# Patient Record
Sex: Female | Born: 1965 | Race: Black or African American | Hispanic: No | Marital: Married | Smoking: Never smoker
Health system: Southern US, Community
[De-identification: ages and names within clinical notes are randomized; demographics above are authoritative.]

## PROBLEM LIST (undated history)

## (undated) DIAGNOSIS — I10 Essential (primary) hypertension: Secondary | ICD-10-CM

---

## 2016-04-08 ENCOUNTER — Emergency Department (HOSPITAL_BASED_OUTPATIENT_CLINIC_OR_DEPARTMENT_OTHER)
Admission: EM | Admit: 2016-04-08 | Discharge: 2016-04-08 | Disposition: A | Discharge status: Home / Self Care | Payer: BLUE CROSS/BLUE SHIELD | Attending: Emergency Medicine | Admitting: Emergency Medicine

## 2016-04-08 ENCOUNTER — Encounter (HOSPITAL_BASED_OUTPATIENT_CLINIC_OR_DEPARTMENT_OTHER): Payer: Self-pay | Admitting: Emergency Medicine

## 2016-04-08 DIAGNOSIS — Z79899 Other long term (current) drug therapy: Secondary | ICD-10-CM | POA: Insufficient documentation

## 2016-04-08 DIAGNOSIS — R102 Pelvic and perineal pain: Secondary | ICD-10-CM

## 2016-04-08 DIAGNOSIS — I10 Essential (primary) hypertension: Secondary | ICD-10-CM | POA: Insufficient documentation

## 2016-04-08 DIAGNOSIS — N760 Acute vaginitis: Secondary | ICD-10-CM | POA: Insufficient documentation

## 2016-04-08 DIAGNOSIS — N390 Urinary tract infection, site not specified: Secondary | ICD-10-CM | POA: Insufficient documentation

## 2016-04-08 HISTORY — DX: Essential (primary) hypertension: I10

## 2016-04-08 LAB — URINALYSIS, ROUTINE W REFLEX MICROSCOPIC
Bilirubin Urine: NEGATIVE
Glucose, UA: NEGATIVE mg/dL
Ketones, ur: NEGATIVE mg/dL
Nitrite: NEGATIVE
Protein, ur: 100 mg/dL — AB
Specific Gravity, Urine: 1.011 (ref 1.005–1.030)
pH: 6.5 (ref 5.0–8.0)

## 2016-04-08 LAB — RAPID HIV SCREEN (HIV 1/2 AB+AG)
HIV 1/2 Antibodies: NONREACTIVE
HIV-1 P24 Antigen - HIV24: NONREACTIVE

## 2016-04-08 LAB — URINE MICROSCOPIC-ADD ON: SQUAMOUS EPITHELIAL / LPF: NONE SEEN

## 2016-04-08 LAB — WET PREP, GENITAL
Sperm: NONE SEEN
Trich, Wet Prep: NONE SEEN
Yeast Wet Prep HPF POC: NONE SEEN

## 2016-04-08 LAB — RAPID STREP SCREEN (MED CTR MEBANE ONLY): Streptococcus, Group A Screen (Direct): NEGATIVE

## 2016-04-08 MED ORDER — METRONIDAZOLE 500 MG PO TABS: 500.0000 mg | ORAL_TABLET | Freq: Two times a day (BID) | ORAL | 0 refills | Status: DC

## 2016-04-08 MED ORDER — IBUPROFEN 800 MG PO TABS
800.0000 mg | ORAL_TABLET | Freq: Once | ORAL | Status: AC
  Administered 2016-04-08: 800 mg via ORAL
  Filled 2016-04-08: qty 1

## 2016-04-08 MED ORDER — ACETAMINOPHEN 325 MG PO TABS
650.0000 mg | ORAL_TABLET | Freq: Once | ORAL | Status: AC
  Administered 2016-04-08: 650 mg via ORAL
  Filled 2016-04-08: qty 2

## 2016-04-08 MED ORDER — CEPHALEXIN 500 MG PO CAPS: 500.0000 mg | ORAL_CAPSULE | Freq: Four times a day (QID) | ORAL | 0 refills | Status: DC

## 2016-04-08 NOTE — ED Provider Notes (Signed)
[facility] Provider Note   CSN: [id number] Arrival date & time: 04/08/16  1548 By signing my name below, I, Georgette Shell, attest that this documentation has been prepared under the direction and in the presence of Deno Etienne, DO. Electronically Signed: Georgette Shell, ED Scribe. 04/08/16. 5:08 PM.  History   Chief Complaint Chief Complaint  Patient presents with  . Pelvic Pain  . Sore Throat   HPI Comments: Paula Shepherd is a 50 y.o. female who presents to the Emergency Department complaining of gradual onset, intermittent, sharp, shooting, 9/10, lower abdominal pain radiating to her bilateral pelvic area onset one day ago. Pt also has associated dysuria, sore throat, and nausea. She notes that pain is exacerbated with sitting. Pt states she spoke to a "teledoctor" who prescribed her a 3 day course of Bactrim which the pt has since completed with no relief. Pt denies fever, cough, vaginal bleeding, hematuria, vomiting, diarrhea, or any other associated symptoms.    The history is provided by the patient. No language interpreter was used.  Pelvic Pain  This is a new problem. The current episode started yesterday. The problem occurs daily. The problem has not changed since onset.Associated symptoms include abdominal pain. Pertinent negatives include no chest pain, no headaches and no shortness of breath. Treatments tried: antibiotic. The treatment provided no relief.    Past Medical History:  Diagnosis Date  . Hypertension     There are no active problems to display for this patient.   Past Surgical History:  Procedure Laterality Date  . CESAREAN SECTION      OB History    No data available       Home Medications    Prior to Admission medications   Medication Sig Start Date End Date Taking? Authorizing Provider  atenolol-chlorthalidone (TENORETIC) 50-25 MG tablet Take 1 tablet by mouth daily.   Yes Historical Provider, MD  sulfamethoxazole-trimethoprim (BACTRIM  DS,SEPTRA DS) 800-160 MG tablet Take 1 tablet by mouth 2 (two) times daily.   Yes Historical Provider, MD  cephALEXin (KEFLEX) 500 MG capsule Take 1 capsule (500 mg total) by mouth 4 (four) times daily. 04/08/16   Deno Etienne, DO  metroNIDAZOLE (FLAGYL) 500 MG tablet Take 1 tablet (500 mg total) by mouth 2 (two) times daily. One po bid x 7 days 04/08/16   Deno Etienne, DO    Family History History reviewed. No pertinent family history.  Social History Social History  Substance Use Topics  . Smoking status: Never Smoker  . Smokeless tobacco: Never Used  . Alcohol use No     Allergies   Review of patient's allergies indicates not on file.   Review of Systems Review of Systems  Constitutional: Negative for chills and fever.  HENT: Positive for sore throat. Negative for congestion and rhinorrhea.   Eyes: Negative for redness and visual disturbance.  Respiratory: Negative for shortness of breath and wheezing.   Cardiovascular: Negative for chest pain and palpitations.  Gastrointestinal: Positive for abdominal pain and nausea. Negative for vomiting.  Genitourinary: Positive for dysuria and pelvic pain. Negative for hematuria and urgency.  Musculoskeletal: Negative for arthralgias and myalgias.  Skin: Negative for pallor and wound.  Neurological: Negative for dizziness and headaches.  All other systems reviewed and are negative.    Physical Exam Updated Vital Signs BP 99/56 (BP Location: Right Arm)   Pulse 78   Temp 101.7 F (38.7 C) (Oral)   Resp 18   Ht 6\' 1"  (1.854 m)  Wt 276 lb (125.2 kg)   LMP 03/07/2016 (Exact Date)   SpO2 99%   BMI 36.41 kg/m   Physical Exam  Constitutional: She is oriented to person, place, and time. She appears well-developed and well-nourished. No distress.  HENT:  Head: Normocephalic and atraumatic.  Eyes: EOM are normal. Pupils are equal, round, and reactive to light.  Neck: Normal range of motion. Neck supple.  Cardiovascular: Normal rate and  regular rhythm.  Exam reveals no gallop and no friction rub.   No murmur heard. Pulmonary/Chest: Effort normal. She has no wheezes. She has no rales.  Abdominal: Soft. She exhibits no distension. There is tenderness.  Mild suprapubic tenderness.  Genitourinary: Cervix exhibits discharge and friability. Cervix exhibits no motion tenderness. Right adnexum displays no mass, no tenderness and no fullness. Left adnexum displays no mass, no tenderness and no fullness.  Genitourinary Comments: Chaperone present throughout entire exam.   Musculoskeletal: She exhibits no edema or tenderness.  Neurological: She is alert and oriented to person, place, and time.  Skin: Skin is warm and dry. She is not diaphoretic.  Psychiatric: She has a normal mood and affect. Her behavior is normal.  Nursing note and vitals reviewed.  ED Treatments / Results  DIAGNOSTIC STUDIES: Oxygen Saturation is 98% on RA, normal by my interpretation.    COORDINATION OF CARE: 5:04 PM Discussed treatment plan with pt at bedside which includes pelvic exam and pt agreed to plan.  Labs (all labs ordered are listed, but only abnormal results are displayed) Labs Reviewed  WET PREP, GENITAL - Abnormal; Notable for the following:       Result Value   Clue Cells Wet Prep HPF POC PRESENT (*)    WBC, Wet Prep HPF POC FEW (*)    All other components within normal limits  URINALYSIS, ROUTINE W REFLEX MICROSCOPIC (NOT AT [facility]) - Abnormal; Notable for the following:    APPearance TURBID (*)    Hgb urine dipstick LARGE (*)    Protein, ur 100 (*)    Leukocytes, UA LARGE (*)    All other components within normal limits  URINE MICROSCOPIC-ADD ON - Abnormal; Notable for the following:    Bacteria, UA MANY (*)    All other components within normal limits  RAPID STREP SCREEN (NOT AT [facility])  CULTURE, GROUP A STREP ([place])  RAPID HIV SCREEN (HIV 1/2 AB+AG)  RPR  GC/CHLAMYDIA PROBE AMP ([place]) NOT AT [facility]    EKG  EKG  Interpretation None       Radiology No results found.  Procedures Procedures (including critical care time)  Medications Ordered in ED Medications  acetaminophen (TYLENOL) tablet 650 mg (650 mg Oral Given 04/08/16 1617)  ibuprofen (ADVIL,MOTRIN) tablet 800 mg (800 mg Oral Given 04/08/16 1817)     Initial Impression / Assessment and Plan / ED Course  I have reviewed the triage vital signs and the nursing notes.  Pertinent labs & imaging results that were available during my care of the patient were reviewed by me and considered in my medical decision making (see chart for details).  Clinical Course    50 yo F With a chief complaint of pelvic pain dysuria and sore throat. Going on for the past couple days. On my exam patient has significant vaginal discharge but no noted cervical motion or adnexal tenderness. Patient is febrile in the ED. I doubt that she has a tubo-ovarian abscess without adnexal tenderness. Rapid strep negative. Patient does have a urinary tract  infection. Will treat with Keflex. PCP follow-up.  11:11 PM:  I have discussed the diagnosis/risks/treatment options with the patient and family and believe the pt to be eligible for discharge home to follow-up with PCP. We also discussed returning to the ED immediately if new or worsening sx occur. We discussed the sx which are most concerning (e.g., sudden worsening pain, fever, inability to tolerate by mouth) that necessitate immediate return. Medications administered to the patient during their visit and any new prescriptions provided to the patient are listed below.  Medications given during this visit Medications  acetaminophen (TYLENOL) tablet 650 mg (650 mg Oral Given 04/08/16 1617)  ibuprofen (ADVIL,MOTRIN) tablet 800 mg (800 mg Oral Given 04/08/16 1817)     The patient appears reasonably screen and/or stabilized for discharge and I doubt any other medical condition or other [facility] requiring further screening,  evaluation, or treatment in the ED at this time prior to discharge.    Final Clinical Impressions(s) / ED Diagnoses   Final diagnoses:  Pelvic pain in female  UTI (lower urinary tract infection)  BV  New Prescriptions Discharge Medication List as of 04/08/2016  6:08 PM    START taking these medications   Details  cephALEXin (KEFLEX) 500 MG capsule Take 1 capsule (500 mg total) by mouth 4 (four) times daily., Starting Sun 04/08/2016, Print    metroNIDAZOLE (FLAGYL) 500 MG tablet Take 1 tablet (500 mg total) by mouth 2 (two) times daily. One po bid x 7 days, Starting Sun 04/08/2016, Print       I personally performed the services described in this documentation, which was scribed in my presence. The recorded information has been reviewed and is accurate.     Deno Etienne, DO 04/08/16 2312

## 2016-04-08 NOTE — ED Triage Notes (Addendum)
Patient reports pelvic pain since yesterday.  Denies vaginal bleeding or discharge.  Describes the pain as a shooting sensation.  Reports nausea.  Denies vomiting, diarrhea. States the pain starts in the lower abdomen and radiates around to both sides.  Reports dysuria.  Denies hematuria.  States she spoke with a "teledoctor" who prescribed her bactrim x3days.  Reports no relief from this.  Patient also reports sore throat as well.

## 2016-04-09 LAB — GC/CHLAMYDIA PROBE AMP (~~LOC~~) NOT AT ARMC
CHLAMYDIA, DNA PROBE: NEGATIVE
Neisseria Gonorrhea: NEGATIVE

## 2016-04-10 ENCOUNTER — Telehealth (HOSPITAL_BASED_OUTPATIENT_CLINIC_OR_DEPARTMENT_OTHER): Payer: Self-pay | Admitting: Emergency Medicine

## 2016-04-10 LAB — RPR: RPR: NONREACTIVE

## 2016-04-11 LAB — CULTURE, GROUP A STREP (THRC)

## 2017-07-11 ENCOUNTER — Emergency Department (HOSPITAL_BASED_OUTPATIENT_CLINIC_OR_DEPARTMENT_OTHER): Payer: BLUE CROSS/BLUE SHIELD

## 2017-07-11 ENCOUNTER — Encounter (HOSPITAL_BASED_OUTPATIENT_CLINIC_OR_DEPARTMENT_OTHER): Payer: Self-pay

## 2017-07-11 ENCOUNTER — Emergency Department (HOSPITAL_BASED_OUTPATIENT_CLINIC_OR_DEPARTMENT_OTHER)
Admission: EM | Admit: 2017-07-11 | Discharge: 2017-07-12 | Disposition: A | Discharge status: Home / Self Care | Payer: BLUE CROSS/BLUE SHIELD | Attending: Emergency Medicine | Admitting: Emergency Medicine

## 2017-07-11 ENCOUNTER — Other Ambulatory Visit: Payer: Self-pay

## 2017-07-11 DIAGNOSIS — R110 Nausea: Secondary | ICD-10-CM | POA: Diagnosis not present

## 2017-07-11 DIAGNOSIS — D259 Leiomyoma of uterus, unspecified: Secondary | ICD-10-CM | POA: Insufficient documentation

## 2017-07-11 DIAGNOSIS — R1031 Right lower quadrant pain: Secondary | ICD-10-CM | POA: Diagnosis not present

## 2017-07-11 DIAGNOSIS — I10 Essential (primary) hypertension: Secondary | ICD-10-CM | POA: Insufficient documentation

## 2017-07-11 DIAGNOSIS — R109 Unspecified abdominal pain: Secondary | ICD-10-CM | POA: Diagnosis present

## 2017-07-11 LAB — COMPREHENSIVE METABOLIC PANEL
ALBUMIN: 3.7 g/dL (ref 3.5–5.0)
ALK PHOS: 77 U/L (ref 38–126)
ALT: 20 U/L (ref 14–54)
ANION GAP: 6 (ref 5–15)
AST: 24 U/L (ref 15–41)
BUN: 14 mg/dL (ref 6–20)
CALCIUM: 8.9 mg/dL (ref 8.9–10.3)
CO2: 29 mmol/L (ref 22–32)
Chloride: 103 mmol/L (ref 101–111)
Creatinine, Ser: 0.7 mg/dL (ref 0.44–1.00)
GFR calc Af Amer: >60 mL/min (ref >60)
GFR calc non Af Amer: >60 mL/min (ref >60)
GLUCOSE: 118 mg/dL — AB (ref 65–99)
Potassium: 3.5 mmol/L (ref 3.5–5.1)
SODIUM: 138 mmol/L (ref 135–145)
Total Bilirubin: 0.4 mg/dL (ref 0.3–1.2)
Total Protein: 6.9 g/dL (ref 6.5–8.1)

## 2017-07-11 LAB — URINALYSIS, ROUTINE W REFLEX MICROSCOPIC
Bilirubin Urine: NEGATIVE
GLUCOSE, UA: NEGATIVE mg/dL
Hgb urine dipstick: NEGATIVE
Ketones, ur: NEGATIVE mg/dL
NITRITE: NEGATIVE
PH: 6 (ref 5.0–8.0)
Protein, ur: NEGATIVE mg/dL
SPECIFIC GRAVITY, URINE: 1.025 (ref 1.005–1.030)

## 2017-07-11 LAB — CBC WITH DIFFERENTIAL/PLATELET
BASOS ABS: 0 10*3/uL (ref 0.0–0.1)
BASOS PCT: 0 %
EOS ABS: 0.3 10*3/uL (ref 0.0–0.7)
Eosinophils Relative: 4 %
HCT: 35.4 % — ABNORMAL LOW (ref 36.0–46.0)
HEMOGLOBIN: 11.6 g/dL — AB (ref 12.0–15.0)
Lymphocytes Relative: 18 %
Lymphs Abs: 1.6 10*3/uL (ref 0.7–4.0)
MCH: 26.5 pg (ref 26.0–34.0)
MCHC: 32.8 g/dL (ref 30.0–36.0)
MCV: 81 fL (ref 78.0–100.0)
Monocytes Absolute: 0.8 10*3/uL (ref 0.1–1.0)
Monocytes Relative: 9 %
NEUTROS PCT: 69 %
Neutro Abs: 5.9 10*3/uL (ref 1.7–7.7)
Platelets: 280 10*3/uL (ref 150–400)
RBC: 4.37 MIL/uL (ref 3.87–5.11)
RDW: 14 % (ref 11.5–15.5)
WBC: 8.6 10*3/uL (ref 4.0–10.5)

## 2017-07-11 LAB — URINALYSIS, MICROSCOPIC (REFLEX): RBC / HPF: NONE SEEN RBC/hpf (ref 0–5)

## 2017-07-11 LAB — PREGNANCY, URINE: Preg Test, Ur: NEGATIVE

## 2017-07-11 LAB — LIPASE, BLOOD: Lipase: 21 U/L (ref 11–51)

## 2017-07-11 MED ORDER — HYDROMORPHONE HCL 1 MG/ML IJ SOLN
0.5000 mg | Freq: Once | INTRAMUSCULAR | Status: AC
  Administered 2017-07-12: 0.5 mg via INTRAVENOUS
  Filled 2017-07-11: qty 1

## 2017-07-11 MED ORDER — ONDANSETRON HCL 4 MG/2ML IJ SOLN
4.0000 mg | Freq: Once | INTRAMUSCULAR | Status: AC
  Administered 2017-07-11: 4 mg via INTRAVENOUS
  Filled 2017-07-11: qty 2

## 2017-07-11 MED ORDER — HYDROMORPHONE HCL 1 MG/ML IJ SOLN
0.5000 mg | Freq: Once | INTRAMUSCULAR | Status: AC
Start: 2017-07-11 — End: 2017-07-11
  Administered 2017-07-11: 0.5 mg via INTRAVENOUS
  Filled 2017-07-11: qty 1

## 2017-07-11 MED ORDER — IOPAMIDOL (ISOVUE-300) INJECTION 61%
100.0000 mL | Freq: Once | INTRAVENOUS | Status: AC | PRN
  Administered 2017-07-11: 100 mL via INTRAVENOUS

## 2017-07-11 NOTE — ED Triage Notes (Signed)
C/o right abd, flank pain x 7 days-nausea-no v/d-NAD-steady gait

## 2017-07-11 NOTE — ED Provider Notes (Signed)
[facility] EMERGENCY DEPARTMENT Provider Note   CSN: [id number] Arrival date & time: 07/11/17  1957     History   Chief Complaint Chief Complaint  Patient presents with  . Abdominal Pain    HPI Paula Shepherd is a 51 y.o. female.  Patient with no past surgical history presents with complaint of abdominal pain ongoing for the past week.  Onset was acute and pain has been gradually worsening.  Patient initially had pain across her entire lower abdomen but now it is more on the right side of the abdomen, right upper quadrant and flank down to her right lower quadrant and into the suprapubic area.  She has decreased appetite and nausea but no vomiting.  No fevers, constipation, diarrhea.  No vaginal bleeding or discharge.  No dysuria or hematuria.  No treatments prior to arrival.  Patient presents tonight as symptoms have not been improving over time.  No history of kidney stones.      Past Medical History:  Diagnosis Date  . Hypertension     There are no active problems to display for this patient.   Past Surgical History:  Procedure Laterality Date  . CESAREAN SECTION      OB History    No data available       Home Medications    Prior to Admission medications   Not on File    Family History No family history on file.  Social History Social History   Tobacco Use  . Smoking status: Never Smoker  . Smokeless tobacco: Never Used  Substance Use Topics  . Alcohol use: Yes    Comment: occ  . Drug use: No     Allergies   Patient has no known allergies.   Review of Systems Review of Systems  Constitutional: Positive for appetite change. Negative for fever.  HENT: Negative for rhinorrhea and sore throat.   Eyes: Negative for redness.  Respiratory: Negative for cough.   Cardiovascular: Negative for chest pain.  Gastrointestinal: Positive for abdominal pain and nausea. Negative for diarrhea and vomiting.  Genitourinary: Negative for dysuria.    Musculoskeletal: Negative for myalgias.  Skin: Negative for rash.  Neurological: Negative for headaches.     Physical Exam Updated Vital Signs BP 123/65 (BP Location: Left Arm)   Pulse 72   Temp 98.6 F (37 C) (Oral)   Resp 18   Ht 6\' 1"  (1.854 m)   Wt 130.2 kg (287 lb)   LMP 03/19/2017   SpO2 100%   BMI 37.87 kg/m   Physical Exam  Constitutional: She appears well-developed and well-nourished.  HENT:  Head: Normocephalic and atraumatic.  Eyes: Conjunctivae are normal. Right eye exhibits no discharge. Left eye exhibits no discharge.  Neck: Normal range of motion. Neck supple.  Cardiovascular: Normal rate, regular rhythm and normal heart sounds.  Pulmonary/Chest: Effort normal and breath sounds normal.  Abdominal: Soft. There is tenderness (Tenderness is mild except in the right lower quadrant where it is moderate.) in the right upper quadrant, right lower quadrant, periumbilical area and suprapubic area. There is no rigidity, no rebound, no guarding, no CVA tenderness, no tenderness at McBurney's point and negative Murphy's sign.  Neurological: She is alert.  Skin: Skin is warm and dry.  Psychiatric: She has a normal mood and affect.  Nursing note and vitals reviewed.    ED Treatments / Results  Labs (all labs ordered are listed, but only abnormal results are displayed) Labs Reviewed  URINALYSIS, ROUTINE W  REFLEX MICROSCOPIC - Abnormal; Notable for the following components:      Result Value   Leukocytes, UA TRACE (*)    All other components within normal limits  URINALYSIS, MICROSCOPIC (REFLEX) - Abnormal; Notable for the following components:   Bacteria, UA RARE (*)    Squamous Epithelial / LPF 0-5 (*)    All other components within normal limits  CBC WITH DIFFERENTIAL/PLATELET - Abnormal; Notable for the following components:   Hemoglobin 11.6 (*)    HCT 35.4 (*)    All other components within normal limits  COMPREHENSIVE METABOLIC PANEL - Abnormal; Notable for  the following components:   Glucose, Bld 118 (*)    All other components within normal limits  PREGNANCY, URINE  LIPASE, BLOOD    EKG  EKG Interpretation None       Radiology Ct Abdomen Pelvis W Contrast  Result Date: 07/11/2017 CLINICAL DATA:  Right-sided abdominal pain and nausea for 1 week EXAM: CT ABDOMEN AND PELVIS WITH CONTRAST TECHNIQUE: Multidetector CT imaging of the abdomen and pelvis was performed using the standard protocol following bolus administration of intravenous contrast. Oral contrast was also administered. CONTRAST:  151mL ISOVUE-300 IOPAMIDOL (ISOVUE-300) INJECTION 61% COMPARISON:  None. FINDINGS: Lower chest: There is atelectatic change in the inferior lingula. Lung bases otherwise are clear. There are foci of aortic atherosclerosis. Hepatobiliary: No focal liver lesions are apparent beyond a tiny cyst in the right lobe anteriorly. Gallbladder wall is not appreciably thickened. There is no biliary duct dilatation. Pancreas: No pancreatic mass or inflammatory focus. Spleen: No splenic lesions are evident. Adrenals/Urinary Tract: Adrenals appear normal. Kidneys bilaterally show no evident mass or hydronephrosis on either side. There is no renal or ureteral calculus on either side. Urinary bladder is midline with wall thickness within normal limits. Stomach/Bowel: There are scattered colonic diverticula without diverticulitis. There is no appreciable bowel wall or mesenteric thickening. No evident bowel obstruction. No free air or portal venous air. Vascular/Lymphatic: There is aortoiliac atherosclerosis. Major mesenteric vessels appear patent. There is no adenopathy evident in the abdomen or pelvis. Reproductive: The uterus is anteverted. Uterus is enlarged and inhomogeneous with multiple leiomyomas within the uterus evident. Largest leiomyoma is along the rightward aspect of the uterus anteriorly measuring 6.3 x 4.7 cm. Apart from the uterus, there are no pelvic masses. Other:  Appendix appears normal. No abscess or ascites is evident in the abdomen or pelvis. There is a small ventral hernia containing only fat. Musculoskeletal: There is multilevel degenerative change in the visualized thoracic and lumbar spine regions. There is spinal stenosis, moderately severe, at L3-4 and L4-5 due to diffuse bony hypertrophy and disc protrusion. 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 No blastic or lytic bone lesions are evident. No intramuscular or abdominal wall lesion evident. IMPRESSION: 1.  Appendix appears normal.  No bowel obstruction.  No abscess. 2.  Enlarged leiomyomatous uterus. 3. Moderately severe spinal stenosis at L3-4 and L4-5, multifactorial. 4.  Aortoiliac atherosclerosis. 5.  Small ventral hernia containing only fat. Aortic Atherosclerosis (ICD10-I70.0). Electronically Signed   By: Lowella Grip III M.D.   On: 07/11/2017 23:13    Procedures Procedures (including critical care time)  Medications Ordered in ED Medications  HYDROmorphone (DILAUDID) injection 0.5 mg (0.5 mg Intravenous Given 07/11/17 2155)  ondansetron (ZOFRAN) injection 4 mg (4 mg Intravenous Given 07/11/17 2155)  iopamidol (ISOVUE-300) 61 % injection 100 mL (100 mLs Intravenous Contrast Given 07/11/17 2250)  HYDROmorphone (DILAUDID) injection 0.5 mg (0.5 mg Intravenous Given 07/12/17 0000)  Initial Impression / Assessment and Plan / ED Course  I have reviewed the triage vital signs and the nursing notes.  Pertinent labs & imaging results that were available during my care of the patient were reviewed by me and considered in my medical decision making (see chart for details).     Patient seen and examined. Work-up initiated. Medications ordered.   Vital signs reviewed and are as follows: BP 123/65 (BP Location: Left Arm)   Pulse 72   Temp 98.6 F (37 C) (Oral)   Resp 18   Ht 6\' 1"  (1.854 m)   Wt 130.2 kg (287 lb)   LMP 03/19/2017   SpO2 100%   BMI 37.87 kg/m    Lab work  pending.  Given wide area of pain from the right upper quadrant, right lower quadrant to the suprapubic area, feel that CT would be the best initial test to evaluate for intra-abdominal etiology including appendicitis.  Patient appears well, nontoxic.  12:10 AM patient updated on CT exam results.  Overall these are reassuring.  She has appointment with her OB/GYN in 6 days.  I urged her to follow-up and discuss fibroids at that time.  Home with short course of Vicodin and Zofran for symptom control.  The patient was urged to return to the Emergency Department immediately with worsening of current symptoms, worsening abdominal pain, persistent vomiting, blood noted in stools, fever, or any other concerns. The patient verbalized understanding.   Patient counseled on use of narcotic pain medications. Counseled not to combine these medications with others containing tylenol. Urged not to drink alcohol, drive, or perform any other activities that requires focus while taking these medications. The patient verbalizes understanding and agrees with the plan.   Final Clinical Impressions(s) / ED Diagnoses   Final diagnoses:  Right lower quadrant abdominal pain  Uterine leiomyoma, unspecified location   Patient with abdominal pain. Vitals are stable, no fever. Labs reassuring. Imaging shows enlarged uterus with fibroids, otherwise no emergent etiology of pain.  No signs of dehydration, patient is tolerating PO's. Lungs are clear and no signs suggestive of PNA. Low concern for appendicitis, cholecystitis, pancreatitis, ruptured viscus, UTI, kidney stone, aortic dissection, aortic aneurysm or other emergent abdominal etiology. Supportive therapy indicated with return if symptoms worsen.    ED Discharge Orders        Ordered    HYDROcodone-acetaminophen (NORCO/VICODIN) 5-325 MG tablet     07/12/17 0003    ondansetron (ZOFRAN ODT) 4 MG disintegrating tablet  Every 8 hours PRN     07/12/17 0003         Carlisle Cater, PA-C 07/12/17 0011    Tanna Furry, MD 07/13/17 [id number]

## 2017-07-12 MED ORDER — ONDANSETRON 4 MG PO TBDP: 4.0000 mg | ORAL_TABLET | Freq: Three times a day (TID) | ORAL | 0 refills | Status: DC | PRN

## 2017-07-12 MED ORDER — HYDROCODONE-ACETAMINOPHEN 5-325 MG PO TABS
ORAL_TABLET | ORAL | 0 refills | Status: DC
Start: 2017-07-12 — End: 2018-02-12

## 2017-07-12 NOTE — Discharge Instructions (Signed)
Please read and follow all provided instructions.  Your diagnoses today include:  1. Right lower quadrant abdominal pain   2. Uterine leiomyoma, unspecified location     Tests performed today include:  Blood counts and electrolytes  Blood tests to check liver and kidney function  Blood tests to check pancreas function  Urine test to look for infection  CT abdomen and pelvis -normal appendix, shows uterine fibroids and uterus is enlarged with multiple fibroids within the uterus, largest fibroid is along the rightward aspect of the uterus anteriorly measuring 6.3 x 4.7 cm. Apart from the uterus, there are no pelvic masses.  Vital signs. See below for your results today.   Medications prescribed:   Vicodin (hydrocodone/acetaminophen) - narcotic pain medication  DO NOT drive or perform any activities that require you to be awake and alert because this medicine can make you drowsy. BE VERY CAREFUL not to take multiple medicines containing Tylenol (also called acetaminophen). Doing so can lead to an overdose which can damage your liver and cause liver failure and possibly death.   Zofran (ondansetron) - for nausea and vomiting  Take any prescribed medications only as directed.  Home care instructions:   Follow any educational materials contained in this packet.  Follow-up instructions: Please follow-up with your OB/GYN next week as planned.    Return instructions:  SEEK IMMEDIATE MEDICAL ATTENTION IF:  The pain does not go away or becomes severe   A temperature above 101F develops   Repeated vomiting occurs (multiple episodes)   The pain becomes localized to portions of the abdomen. The right side could possibly be appendicitis. In an adult, the left lower portion of the abdomen could be colitis or diverticulitis.   Blood is being passed in stools or vomit (bright red or black tarry stools)   You develop chest pain, difficulty breathing, dizziness or fainting, or become  confused, poorly responsive, or inconsolable (young children)  If you have any other emergent concerns regarding your health  Additional Information: Abdominal (belly) pain can be caused by many things. Your caregiver performed an examination and possibly ordered blood/urine tests and imaging (CT scan, x-rays, ultrasound). Many cases can be observed and treated at home after initial evaluation in the emergency department. Even though you are being discharged home, abdominal pain can be unpredictable. Therefore, you need a repeated exam if your pain does not resolve, returns, or worsens. Most patients with abdominal pain don't have to be admitted to the hospital or have surgery, but serious problems like appendicitis and gallbladder attacks can start out as nonspecific pain. Many abdominal conditions cannot be diagnosed in one visit, so follow-up evaluations are very important.  Your vital signs today were: BP 118/65 (BP Location: Left Arm)    Pulse 64    Temp 98.6 F (37 C) (Oral)    Resp 18    Ht 6\' 1"  (1.854 m)    Wt 130.2 kg (287 lb)    LMP 03/19/2017    SpO2 99%    BMI 37.87 kg/m  If your blood pressure (bp) was elevated above 135/85 this visit, please have this repeated by your doctor within one month. --------------

## 2018-02-12 ENCOUNTER — Other Ambulatory Visit: Payer: Self-pay

## 2018-02-12 ENCOUNTER — Emergency Department (HOSPITAL_BASED_OUTPATIENT_CLINIC_OR_DEPARTMENT_OTHER)
Admission: EM | Admit: 2018-02-12 | Discharge: 2018-02-12 | Disposition: A | Discharge status: Home / Self Care | Payer: BLUE CROSS/BLUE SHIELD | Attending: Emergency Medicine | Admitting: Emergency Medicine

## 2018-02-12 ENCOUNTER — Encounter (HOSPITAL_BASED_OUTPATIENT_CLINIC_OR_DEPARTMENT_OTHER): Payer: Self-pay | Admitting: Emergency Medicine

## 2018-02-12 ENCOUNTER — Emergency Department (HOSPITAL_BASED_OUTPATIENT_CLINIC_OR_DEPARTMENT_OTHER): Payer: BLUE CROSS/BLUE SHIELD

## 2018-02-12 DIAGNOSIS — R079 Chest pain, unspecified: Secondary | ICD-10-CM

## 2018-02-12 DIAGNOSIS — R0789 Other chest pain: Secondary | ICD-10-CM | POA: Diagnosis not present

## 2018-02-12 DIAGNOSIS — I10 Essential (primary) hypertension: Secondary | ICD-10-CM | POA: Diagnosis not present

## 2018-02-12 LAB — BASIC METABOLIC PANEL
Anion gap: 8 (ref 5–15)
BUN: 14 mg/dL (ref 6–20)
CO2: 26 mmol/L (ref 22–32)
CREATININE: 0.81 mg/dL (ref 0.44–1.00)
Calcium: 8.6 mg/dL — ABNORMAL LOW (ref 8.9–10.3)
Chloride: 103 mmol/L (ref 98–111)
GFR calc Af Amer: >60 mL/min (ref >60)
Glucose, Bld: 123 mg/dL — ABNORMAL HIGH (ref 70–99)
Potassium: 3 mmol/L — ABNORMAL LOW (ref 3.5–5.1)
SODIUM: 137 mmol/L (ref 135–145)

## 2018-02-12 LAB — CBC WITH DIFFERENTIAL/PLATELET
BASOS ABS: 0 10*3/uL (ref 0.0–0.1)
BASOS PCT: 0 %
EOS ABS: 0.5 10*3/uL (ref 0.0–0.7)
Eosinophils Relative: 7 %
HCT: 33.7 % — ABNORMAL LOW (ref 36.0–46.0)
Hemoglobin: 11.6 g/dL — ABNORMAL LOW (ref 12.0–15.0)
Lymphocytes Relative: 25 %
Lymphs Abs: 1.7 10*3/uL (ref 0.7–4.0)
MCH: 27.6 pg (ref 26.0–34.0)
MCHC: 34.4 g/dL (ref 30.0–36.0)
MCV: 80.2 fL (ref 78.0–100.0)
Monocytes Absolute: 0.5 10*3/uL (ref 0.1–1.0)
Monocytes Relative: 8 %
Neutro Abs: 3.9 10*3/uL (ref 1.7–7.7)
Neutrophils Relative %: 60 %
PLATELETS: 248 10*3/uL (ref 150–400)
RBC: 4.2 MIL/uL (ref 3.87–5.11)
RDW: 14.4 % (ref 11.5–15.5)
WBC: 6.6 10*3/uL (ref 4.0–10.5)

## 2018-02-12 LAB — TROPONIN I

## 2018-02-12 NOTE — Discharge Instructions (Signed)
Your heart and lung tests look good.  You should follow-up with your regular doctor to discuss whether you should continue taking the over-the-counter iron.  You could also try topical muscle rub cream to this area or over-the-counter lidocaine patches.  Return if chest pain worsens or continues or shortness of breath worsens.

## 2018-02-12 NOTE — ED Triage Notes (Signed)
Reports left sided intermittent chest pain x 2 weeks.  Reports shortness of breath, nausea.  Additionally endorses left arm pain.

## 2018-02-12 NOTE — ED Provider Notes (Signed)
[facility] EMERGENCY DEPARTMENT Provider Note   CSN: [id number] Arrival date & time: 02/12/18  1558      History   Chief Complaint Chief Complaint  Patient presents with  . Chest Pain    HPI Paula Shepherd is a 52 y.o. female.  HPI   Patient presents today with left-sided upper chest pain which has been going on for 2 to 3 weeks.  She states she feels as if this is worsening over that time.  She has never had anything like this before, endorses the pain as a shooting quality going down her left arm.  She also states that her left arm is numb today which is new.  She has tried aspirin for couple of these episodes.  She has had 4 discrete episodes of this shooting pain today.  She also notes that she feels very fatigued over the last month as well has tried iron over-the-counter for this.  No personal history of coronary artery disease no family history of early heart disease.  Denies recent prolonged travel, recent URI, fevers.  Denies pain or weakness anywhere else. States her last menstrual period was 08/2017. Pain does not change with meals or lying flat. Only med change was recent decrease in BP medication.   Past Medical History:  Diagnosis Date  . Hypertension   HLD Eczema  There are no active problems to display for this patient.   Past Surgical History:  Procedure Laterality Date  . CESAREAN SECTION       OB History   None      Home Medications    Prior to Admission medications   Medication Sig Start Date End Date Taking? Authorizing Provider  atenolol-chlorthalidone (TENORETIC) 50-25 MG tablet Take 0.5 tablets by mouth every morning. 02/05/18   [provider]  atorvastatin (LIPITOR) 40 MG tablet Take 40 mg by mouth at bedtime. 12/21/17   [provider]    Family History History reviewed. No pertinent family history.  Social History Social History   Tobacco Use  . Smoking status: Never Smoker  . Smokeless tobacco: Never Used    Substance Use Topics  . Alcohol use: Yes    Comment: occ  . Drug use: No     Allergies   Patient has no known allergies.   Review of Systems Review of Systems  Constitutional: Positive for fatigue. Negative for activity change, appetite change, fever and unexpected weight change.  HENT: Negative for congestion, rhinorrhea and sore throat.   Respiratory: Positive for shortness of breath. Negative for cough and chest tightness.   Cardiovascular: Positive for chest pain. Negative for palpitations and leg swelling.  Gastrointestinal: Negative for abdominal pain.  Genitourinary: Negative for dysuria.  Skin: Negative for rash.  Neurological: Positive for numbness.  All other systems reviewed and are negative.    Physical Exam Updated Vital Signs BP (!) 106/58   Pulse 63   Temp 98.3 F (36.8 C) (Oral)   Resp 20   Ht 6\' 1"  (1.854 m)   Wt 130.2 kg (287 lb)   SpO2 96%   BMI 37.87 kg/m   Physical Exam  Constitutional: She is oriented to person, place, and time. She appears well-developed and well-nourished. She does not appear ill.  HENT:  Head: Normocephalic and atraumatic.  Eyes: Pupils are equal, round, and reactive to light. EOM are normal.  Neck: Neck supple.  Spurlings negative.  Cardiovascular: Normal rate and regular rhythm.  No murmur heard. Pulmonary/Chest: Effort normal and  breath sounds normal.  Abdominal: Soft. Bowel sounds are normal.  Musculoskeletal: Normal range of motion.       Right lower leg: Normal. She exhibits no edema.       Left lower leg: Normal. She exhibits no edema.  Neurological: She is alert and oriented to person, place, and time.  Skin: Skin is warm and dry.     ED Treatments / Results  Labs (all labs ordered are listed, but only abnormal results are displayed) Labs Reviewed  BASIC METABOLIC PANEL - Abnormal; Notable for the following components:      Result Value   Potassium 3.0 (*)    Glucose, Bld 123 (*)    Calcium 8.6 (*)     All other components within normal limits  CBC WITH DIFFERENTIAL/PLATELET - Abnormal; Notable for the following components:   Hemoglobin 11.6 (*)    HCT 33.7 (*)    All other components within normal limits  TROPONIN I    EKG EKG Interpretation  Date/Time:  Wednesday February 12 2018 16:09:00 EDT Ventricular Rate:  66 PR Interval:  192 QRS Duration: 106 QT Interval:  420 QTC Calculation: 440 R Axis:   -4 Text Interpretation:  Normal sinus rhythm Normal ECG No STEMI.  Confirmed by Nanda Quinton [phone]) on 02/12/2018 4:38:36 PM   Radiology Dg Chest 2 View  Result Date: 02/12/2018 CLINICAL DATA:  Left upper chest pain EXAM: CHEST - 2 VIEW COMPARISON:  None. FINDINGS: The heart size and mediastinal contours are within normal limits. Both lungs are clear. The visualized skeletal structures are unremarkable. Atherosclerosis of the aorta. Degenerative changes of the spine. IMPRESSION: No active cardiopulmonary disease. Electronically Signed   By: Jerilynn Mages.  Shick M.D.   On: 02/12/2018 17:47    Procedures Procedures (including critical care time)  Medications Ordered in ED Medications - No data to display   Initial Impression / Assessment and Plan / ED Course  I have reviewed the triage vital signs and the nursing notes.  Pertinent labs & imaging results that were available during my care of the patient were reviewed by me and considered in my medical decision making (see chart for details).     History atypical, HEART score likely 2-3 pending troponin. EKG without concerning findings.   Workup does not reveal concerning cardiopulmonary cause, etiology likely MSK vs radicular. Reassured patient about lack of cardiac findings, asked her to follow up with PCP if persistent.   Final Clinical Impressions(s) / ED Diagnoses   Final diagnoses:  Atypical chest pain    ED Discharge Orders    None       Sela Hilding, MD 02/12/18 Flora Lipps, MD 02/13/18 [phone]

## 2018-10-04 ENCOUNTER — Encounter (HOSPITAL_BASED_OUTPATIENT_CLINIC_OR_DEPARTMENT_OTHER): Payer: Self-pay | Admitting: Adult Health

## 2018-10-04 ENCOUNTER — Other Ambulatory Visit: Payer: Self-pay

## 2018-10-04 ENCOUNTER — Emergency Department (HOSPITAL_BASED_OUTPATIENT_CLINIC_OR_DEPARTMENT_OTHER)
Admission: EM | Admit: 2018-10-04 | Discharge: 2018-10-04 | Disposition: A | Discharge status: Home / Self Care | Payer: BLUE CROSS/BLUE SHIELD | Attending: Emergency Medicine | Admitting: Emergency Medicine

## 2018-10-04 ENCOUNTER — Emergency Department (HOSPITAL_BASED_OUTPATIENT_CLINIC_OR_DEPARTMENT_OTHER): Payer: BLUE CROSS/BLUE SHIELD

## 2018-10-04 DIAGNOSIS — M25512 Pain in left shoulder: Secondary | ICD-10-CM | POA: Diagnosis not present

## 2018-10-04 DIAGNOSIS — I10 Essential (primary) hypertension: Secondary | ICD-10-CM | POA: Insufficient documentation

## 2018-10-04 DIAGNOSIS — Z79899 Other long term (current) drug therapy: Secondary | ICD-10-CM | POA: Diagnosis not present

## 2018-10-04 MED ORDER — METHOCARBAMOL 750 MG PO TABS: 750.0000 mg | ORAL_TABLET | Freq: Three times a day (TID) | ORAL | 0 refills | Status: AC | PRN

## 2018-10-04 MED ORDER — ACETAMINOPHEN 500 MG PO TABS
1000.0000 mg | ORAL_TABLET | Freq: Once | ORAL | Status: AC
  Administered 2018-10-04: 1000 mg via ORAL
  Filled 2018-10-04: qty 2

## 2018-10-04 MED ORDER — IBUPROFEN 400 MG PO TABS
400.0000 mg | ORAL_TABLET | Freq: Once | ORAL | Status: AC
  Administered 2018-10-04: 400 mg via ORAL
  Filled 2018-10-04: qty 1

## 2018-10-04 NOTE — ED Triage Notes (Signed)
Presents with 4 days of left anterior shoulder pain described as burning that goes down into her hand. She states the pain is severe and she is unable to lift her shoulder, lie on her left side and touch hurts it as well. She has taken ibuprofen and ice with no relief.

## 2018-10-04 NOTE — ED Notes (Signed)
Pt enrolled in aromatherapy pain trial 

## 2018-10-04 NOTE — ED Provider Notes (Signed)
[facility] EMERGENCY DEPARTMENT Provider Note   CSN: [id number] Arrival date & time: 10/04/18  0102    History   Chief Complaint Chief Complaint  Patient presents with  . Shoulder Pain    HPI Nancye Grumbine is a 53 y.o. female.     Patient c/o pain to left shoulder for the past 4 days. Pain dull, moderate, gradual onset, constant, radiating towards left arm. Pain worse w certain movements/positions of shoulder.   States day prior to increased pain, did some heavy lifting at work. No direct trauma/fall. States similar left sided shoulder/arm pain in past, but worse in past 4 days. No midline neck or back pain. No numbness/weakness or loss of normal function of arm. No arm swelling or redness. No rash/skin lesions. No fever or chills.   The history is provided by the patient.  Shoulder Pain  Associated symptoms: no fever and no neck pain     Past Medical History:  Diagnosis Date  . Hypertension     There are no active problems to display for this patient.   Past Surgical History:  Procedure Laterality Date  . CESAREAN SECTION       OB History   No obstetric history on file.      Home Medications    Prior to Admission medications   Medication Sig Start Date End Date Taking? Authorizing Provider  atenolol-chlorthalidone (TENORETIC) 50-25 MG tablet Take 0.5 tablets by mouth every morning. 02/05/18   [provider]  atorvastatin (LIPITOR) 40 MG tablet Take 40 mg by mouth at bedtime. 12/21/17   [provider]  cyclobenzaprine (FLEXERIL) 5 MG tablet Take 5 mg by mouth 3 (three) times daily as needed. for muscle spams 09/02/18   [provider]    Family History History reviewed. No pertinent family history.  Social History Social History   Tobacco Use  . Smoking status: Never Smoker  . Smokeless tobacco: Never Used  Substance Use Topics  . Alcohol use: Yes    Comment: occ  . Drug use: No     Allergies   Patient has no  known allergies.   Review of Systems Review of Systems  Constitutional: Negative for chills and fever.  Respiratory: Negative for shortness of breath.   Cardiovascular: Negative for chest pain.  Musculoskeletal: Negative for neck pain.  Skin: Negative for rash.  Neurological: Negative for weakness, numbness and headaches.     Physical Exam Updated Vital Signs BP 130/81 (BP Location: Right Arm)   Pulse 79   Temp 98.3 F (36.8 C) (Oral)   Resp 18   Ht 1.854 m (6\' 1" )   Wt 128.8 kg   SpO2 100%   BMI 37.47 kg/m   Physical Exam Vitals signs and nursing note reviewed.  Constitutional:      Appearance: Normal appearance. She is well-developed.  HENT:     Head: Atraumatic.     Nose: Nose normal.     Mouth/Throat:     Mouth: Mucous membranes are moist.  Eyes:     General: No scleral icterus.    Conjunctiva/sclera: Conjunctivae normal.  Neck:     Musculoskeletal: Normal range of motion and neck supple. No neck rigidity or muscular tenderness.     Trachea: No tracheal deviation.  Cardiovascular:     Rate and Rhythm: Normal rate.     Pulses: Normal pulses.  Pulmonary:     Effort: Pulmonary effort is normal. No respiratory distress.  Abdominal:  General: There is no distension.  Genitourinary:    Comments: No cva tenderness.  Musculoskeletal:     Comments: C/T spine non tender, aligned. Muscular tenderness left upper back, left trapezius, and left shoulder. Slow passive rom right shoulder without pain. Radial pulse 2+. No LUE swelling.   Skin:    General: Skin is warm and dry.     Findings: No rash.  Neurological:     Mental Status: She is alert.     Comments: Alert, speech normal. LUE stre 5/5. Rad/med/uln fxn, motor and sens, intact.   Psychiatric:        Mood and Affect: Mood normal.      ED Treatments / Results  Labs (all labs ordered are listed, but only abnormal results are displayed) Labs Reviewed - No data to display  EKG None  Radiology Dg  Shoulder Left  Result Date: 10/04/2018 CLINICAL DATA:  Intermittent left shoulder pain worsening over the last 4 days. EXAM: LEFT SHOULDER - 2+ VIEW COMPARISON:  None. FINDINGS: Glenohumeral joint is normal. Normal humeral acromial distance. Calcification adjacent to the greater tuberosity of the humerus probably represents chronic rotator cuff calcification. Mild osteoarthritis affects the AC joint. IMPRESSION: No acute finding.  Probable chronic rotator cuff calcification. Electronically Signed   By: Nelson Chimes M.D.   On: 10/04/2018 10:42    Procedures Procedures (including critical care time)  Medications Ordered in ED Medications  ibuprofen (ADVIL,MOTRIN) tablet 400 mg (has no administration in time range)     Initial Impression / Assessment and Plan / ED Course  I have reviewed the triage vital signs and the nursing notes.  Pertinent labs & imaging results that were available during my care of the patient were reviewed by me and considered in my medical decision making (see chart for details).  Motrin po.   xrays ordered.   Reviewed nursing notes and prior charts for additional history.   Exam felt most c/w msk pain. Discussed diff dx, nerve impingement, muscle spasm, rotator cuff strain, ddd, etc.   rx robaxin. Motrin/aleve prn. pcp follow up recommended.   Xrays reviewed - no acute process. Discussed results w pt.       Final Clinical Impressions(s) / ED Diagnoses   Final diagnoses:  None    ED Discharge Orders    None       Lajean Saver, MD 10/04/18 1051

## 2018-10-04 NOTE — Discharge Instructions (Signed)
It was our pleasure to provide your ER care today - we hope that you feel better.  Take motrin or aleve as need for pain. You may take robaxin as need for muscle pain/spasm - no driving when taking.   Try heat therapy, passive range of motion, and gentle massage to area for symptom relief.  Follow up with primary care doctor/orthopedic doctor in the coming week - discuss possible physical therapy and other therapies.

## 2019-08-27 ENCOUNTER — Ambulatory Visit: Payer: BLUE CROSS/BLUE SHIELD | Admitting: Allergy and Immunology

## 2021-02-22 ENCOUNTER — Emergency Department (HOSPITAL_BASED_OUTPATIENT_CLINIC_OR_DEPARTMENT_OTHER)
Admission: EM | Admit: 2021-02-22 | Discharge: 2021-02-22 | Disposition: A | Discharge status: Home / Self Care | Payer: BC Managed Care – PPO | Attending: Emergency Medicine | Admitting: Emergency Medicine

## 2021-02-22 ENCOUNTER — Encounter (HOSPITAL_BASED_OUTPATIENT_CLINIC_OR_DEPARTMENT_OTHER): Payer: Self-pay

## 2021-02-22 ENCOUNTER — Emergency Department (HOSPITAL_BASED_OUTPATIENT_CLINIC_OR_DEPARTMENT_OTHER): Payer: BC Managed Care – PPO

## 2021-02-22 ENCOUNTER — Other Ambulatory Visit: Payer: Self-pay

## 2021-02-22 DIAGNOSIS — I10 Essential (primary) hypertension: Secondary | ICD-10-CM | POA: Insufficient documentation

## 2021-02-22 DIAGNOSIS — M25511 Pain in right shoulder: Secondary | ICD-10-CM | POA: Diagnosis not present

## 2021-02-22 DIAGNOSIS — Z79899 Other long term (current) drug therapy: Secondary | ICD-10-CM | POA: Insufficient documentation

## 2021-02-22 MED ORDER — OXYCODONE-ACETAMINOPHEN 5-325 MG PO TABS: 1.0000 | ORAL_TABLET | Freq: Four times a day (QID) | ORAL | 0 refills | Status: AC | PRN

## 2021-02-22 MED ORDER — OXYCODONE-ACETAMINOPHEN 5-325 MG PO TABS
1.0000 | ORAL_TABLET | Freq: Once | ORAL | Status: AC
  Administered 2021-02-22: 1 via ORAL
  Filled 2021-02-22: qty 1

## 2021-02-22 NOTE — Discharge Instructions (Addendum)
Please follow-up with sports medicine or orthopedic specialist.  Take Tylenol or Motrin for pain control.  For breakthrough pain can take the prescribed Percocet as needed.  If you develop fever, swelling, redness, or other new concerning symptom, come back to ER for reassessment.

## 2021-02-22 NOTE — ED Provider Notes (Signed)
[facility] EMERGENCY DEPARTMENT Provider Note   CSN: [id number] Arrival date & time: 02/22/21  1158     History Chief Complaint  Patient presents with   Shoulder Pain    Paula Shepherd is a 55 y.o. female.  Presented to ER with concern for shoulder pain.  Patient states that a couple years ago she had an episode of having shoulder pain for a few days or couple weeks and eventually it got better.  Episode today started few days ago.  Does not recall any specific injuries.  Has not noted any swelling or redness.  No fevers or chills.  Reports that she went to urgent care and was given muscle relaxer which has not helped.  Did not have x-rays done at that time.  Pain does not radiate.  Worse with movement, improved with rest.  Worse with certain positions.  No associated chest pain, no difficulty in breathing.  No jaw pain.  HPI     Past Medical History:  Diagnosis Date   Hypertension     There are no problems to display for this patient.   Past Surgical History:  Procedure Laterality Date   CESAREAN SECTION       OB History   No obstetric history on file.     No family history on file.  Social History   Tobacco Use   Smoking status: Never   Smokeless tobacco: Never  Vaping Use   Vaping Use: Never used  Substance Use Topics   Alcohol use: Yes    Comment: occ   Drug use: No    Home Medications Prior to Admission medications   Medication Sig Start Date End Date Taking? Authorizing Provider  oxyCODONE-acetaminophen (PERCOCET/ROXICET) 5-325 MG tablet Take 1 tablet by mouth every 6 (six) hours as needed for up to 3 days for severe pain. 02/22/21 02/25/21 Yes Shaneen Reeser, Ellwood Dense, MD  atenolol-chlorthalidone (TENORETIC) 50-25 MG tablet Take 0.5 tablets by mouth every morning. 02/05/18   [provider]  atorvastatin (LIPITOR) 40 MG tablet Take 40 mg by mouth at bedtime. 12/21/17   [provider]  cyclobenzaprine (FLEXERIL) 5 MG tablet Take 5 mg by  mouth 3 (three) times daily as needed. for muscle spams 09/02/18   [provider]  methocarbamol (ROBAXIN) 750 MG tablet Take 1 tablet (750 mg total) by mouth 3 (three) times daily as needed (muscle spasm/pain). 10/04/18   Lajean Saver, MD    Allergies    Patient has no known allergies.  Review of Systems   Review of Systems  Constitutional:  Negative for chills and fever.  Respiratory:  Negative for shortness of breath.   Cardiovascular:  Negative for chest pain.  Musculoskeletal:  Positive for arthralgias.  Neurological:  Negative for weakness and numbness.  All other systems reviewed and are negative.  Physical Exam Updated Vital Signs BP 129/70 (BP Location: Right Arm)   Pulse (!) 55   Temp 98.7 F (37.1 C) (Oral)   Resp 15   Ht '6\' 1"'$  (1.854 m)   Wt 129.7 kg   SpO2 98%   BMI 37.73 kg/m   Physical Exam Vitals and nursing note reviewed.  Constitutional:      General: She is not in acute distress.    Appearance: She is well-developed.  HENT:     Head: Normocephalic and atraumatic.  Eyes:     Conjunctiva/sclera: Conjunctivae normal.  Cardiovascular:     Rate and Rhythm: Normal rate.     Pulses:  Normal pulses.  Pulmonary:     Effort: Pulmonary effort is normal. No respiratory distress.  Musculoskeletal:     Cervical back: Neck supple.     Comments: Right upper extremity: There is tenderness to palpation over the right shoulder joint, there is no joint swelling, no erythema, range of motion of shoulder is somewhat painful but patient still has full ROM, distal sensation, radial pulse and motor intact  Skin:    General: Skin is warm and dry.  Neurological:     General: No focal deficit present.     Mental Status: She is alert.     Sensory: No sensory deficit.     Motor: No weakness.    ED Results / Procedures / Treatments   Labs (all labs ordered are listed, but only abnormal results are displayed) Labs Reviewed - No data to  display  EKG None  Radiology DG Shoulder Right  Result Date: 02/22/2021 CLINICAL DATA:  Shoulder pain. EXAM: RIGHT SHOULDER - 2+ VIEW COMPARISON:  No prior. FINDINGS: Acromioclavicular and glenohumeral degenerative change. Calcific densities noted over the supraspinatus tendon consistent with calcific supraspinatus tendinosis. No evidence of fracture, dislocation, or separation. 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 No acute abnormality. Electronically Signed   By: Marcello Moores  Register   On: 02/22/2021 17:00    Procedures Procedures   Medications Ordered in ED Medications  oxyCODONE-acetaminophen (PERCOCET/ROXICET) 5-325 MG per tablet 1 tablet (1 tablet Oral Given 02/22/21 1523)    ED Course  I have reviewed the triage vital signs and the nursing notes.  Pertinent labs & imaging results that were available during my care of the patient were reviewed by me and considered in my medical decision making (see chart for details).    MDM Rules/Calculators/A&P                           55 year old lady presents to ER with concern for right shoulder pain.  She does have some tenderness on exam to the shoulder.  Range of motion is intact albeit with some pain.  No fever, joint swelling, erythema, low suspicion for infectious process at present.  Pain is very positional, suspect MSK in nature.  Plain films negative.  Recommend she follow-up with Ortho or sports medicine for further evaluation. Neurovascularly intact.    After the discussed management above, the patient was determined to be safe for discharge.  The patient was in agreement with this plan and all questions regarding their care were answered.  ED return precautions were discussed and the patient will return to the ED with any significant worsening of condition.  Final Clinical Impression(s) / ED Diagnoses Final diagnoses:  Right shoulder pain, unspecified chronicity    Rx  / DC Orders ED Discharge Orders          Ordered    oxyCODONE-acetaminophen (PERCOCET/ROXICET) 5-325 MG tablet  Every 6 hours PRN        02/22/21 1716             Lucrezia Starch, MD 02/23/21 1553

## 2021-02-22 NOTE — ED Triage Notes (Addendum)
Pt states she woke 7/29 with pain to right shoulder-denies injury-pain worse with movment-states she was seen at [facility] in [place] yesterday-no xray due to no injury-was given steriod shot and rx muscle relaxer-states pain no better-NAD-steady gait-pt pleasantly declined xray until seen by EDP/PA

## 2021-02-27 ENCOUNTER — Other Ambulatory Visit: Payer: Self-pay

## 2021-02-27 ENCOUNTER — Ambulatory Visit (INDEPENDENT_AMBULATORY_CARE_PROVIDER_SITE_OTHER): Payer: BC Managed Care – PPO | Admitting: Family Medicine

## 2021-02-27 ENCOUNTER — Ambulatory Visit: Payer: Self-pay

## 2021-02-27 ENCOUNTER — Encounter: Payer: Self-pay | Admitting: Family Medicine

## 2021-02-27 VITALS — BP 140/82 | Ht 73.0 in | Wt 286.0 lb

## 2021-02-27 DIAGNOSIS — M7551 Bursitis of right shoulder: Secondary | ICD-10-CM | POA: Diagnosis not present

## 2021-02-27 DIAGNOSIS — M25511 Pain in right shoulder: Secondary | ICD-10-CM

## 2021-02-27 MED ORDER — METHYLPREDNISOLONE ACETATE 40 MG/ML IJ SUSP
40.0000 mg | Freq: Once | INTRAMUSCULAR | Status: AC
  Administered 2021-02-27: 40 mg via INTRA_ARTICULAR

## 2021-02-27 NOTE — Progress Notes (Signed)
Paula Shepherd - 55 y.o. female MRN [id number]  Date of birth: [birth year]  SUBJECTIVE:  Including CC & ROS.  No chief complaint on file.   Paula Shepherd is a 55 y.o. female that is presenting with acute right shoulder pain.  This pain occurred similarly about 2 years ago.  The pain is on the lateral portion of the shoulder.  Denies any injury or inciting event.  Is severe in nature.  Has gotten improvement with the sling..  Independent review of the right shoulder x-ray from 8/3 shows calcific changes of the supraspinatus with degenerative changes of the glenohumeral joint.   Review of Systems See HPI   HISTORY: Past Medical, Surgical, Social, and Family History Reviewed & Updated per EMR.   Pertinent Historical Findings include:  Past Medical History:  Diagnosis Date   Hypertension     Past Surgical History:  Procedure Laterality Date   CESAREAN SECTION      History reviewed. No pertinent family history.  Social History   Socioeconomic History   Marital status: Married    Spouse name: Not on file   Number of children: Not on file   Years of education: Not on file   Highest education level: Not on file  Occupational History   Not on file  Tobacco Use   Smoking status: Never   Smokeless tobacco: Never  Vaping Use   Vaping Use: Never used  Substance and Sexual Activity   Alcohol use: Yes    Comment: occ   Drug use: No   Sexual activity: Not on file  Other Topics Concern   Not on file  Social History Narrative   Not on file   Social Determinants of Health   Financial Resource Strain: Not on file  Food Insecurity: Not on file  Transportation Needs: Not on file  Physical Activity: Not on file  Stress: Not on file  Social Connections: Not on file  Intimate Partner Violence: Not on file     PHYSICAL EXAM:  VS: BP 140/82 (BP Location: Left Arm, Patient Position: Sitting, Cuff Size: Large)   Ht '6\' 1"'$  (1.854 m)   Wt 286 lb (129.7 kg)   BMI 37.73 kg/m   Physical Exam Gen: NAD, alert, cooperative with exam, well-appearing MSK:  Limited external rotation. Limited abduction and flexion. Neurovascular intact  Limited ultrasound: Right shoulder:  Normal-appearing biceps tendon. Large subacromial bursitis that extends over the proximal humerus. No changes of the supraspinatus.   No hyperemia appreciated  Summary: Large subacromial bursitis  Ultrasound and interpretation by Clearance Coots, MD   Aspiration/Injection Procedure Note Paula Shepherd March 18, 1966  Procedure: Injection Indications: Right shoulder pain  Procedure Details Consent: Risks of procedure as well as the alternatives and risks of each were explained to the (patient/caregiver).  Consent for procedure obtained. Time Out: Verified patient identification, verified procedure, site/side was marked, verified correct patient position, special equipment/implants available, medications/allergies/relevent history reviewed, required imaging and test results available.  Performed.  The area was cleaned with iodine and alcohol swabs.    The right subacromial bursa was injected using 3 cc of 1% lidocaine on a 22-gauge 1-1/2 inch needle.  The syringe was switched to mixture containing 1 cc's of 40 Depo-Medrol and 4 cc's of 0.25% bupivacaine was injected.  Ultrasound was used. Images were obtained in short views showing the injection.     A sterile dressing was applied.  Patient did tolerate procedure well.    ASSESSMENT & PLAN:   Subacromial bursitis of  right shoulder joint Has a large bursa over the lateral portion which may be emanating or extending from the joint. -Counseled on home exercise therapy and supportive care. -Injection today. -Could consider further imaging or physical therapy.

## 2021-02-27 NOTE — Patient Instructions (Signed)
Nice to meet you Please try ice  Please try the exercises   Please send me a message in MyChart with any questions or updates.  Please see me back in 3-4 weeks.   --Dr. Raeford Razor

## 2021-02-27 NOTE — Assessment & Plan Note (Signed)
Has a large bursa over the lateral portion which may be emanating or extending from the joint. -Counseled on home exercise therapy and supportive care. -Injection today. -Could consider further imaging or physical therapy.

## 2021-03-03 ENCOUNTER — Ambulatory Visit: Payer: BC Managed Care – PPO | Admitting: Sports Medicine

## 2021-03-24 ENCOUNTER — Other Ambulatory Visit: Payer: Self-pay

## 2021-03-24 ENCOUNTER — Ambulatory Visit (INDEPENDENT_AMBULATORY_CARE_PROVIDER_SITE_OTHER): Payer: BC Managed Care – PPO | Admitting: Family Medicine

## 2021-03-24 ENCOUNTER — Encounter: Payer: Self-pay | Admitting: Family Medicine

## 2021-03-24 DIAGNOSIS — M7551 Bursitis of right shoulder: Secondary | ICD-10-CM | POA: Diagnosis not present

## 2021-03-24 NOTE — Progress Notes (Signed)
  Paula Shepherd - 55 y.o. female MRN [id number]  Date of birth: 07/23/1966  SUBJECTIVE:  Including CC & ROS.  No chief complaint on file.   Paula Shepherd is a 55 y.o. female that is following up for her right shoulder.  She is able to have her range of motion back.  She still has pain from time to time.  She uses ibuprofen as needed.   Review of Systems See HPI   HISTORY: Past Medical, Surgical, Social, and Family History Reviewed & Updated per EMR.   Pertinent Historical Findings include:  Past Medical History:  Diagnosis Date   Hypertension     Past Surgical History:  Procedure Laterality Date   CESAREAN SECTION      History reviewed. No pertinent family history.  Social History   Socioeconomic History   Marital status: Married    Spouse name: Not on file   Number of children: Not on file   Years of education: Not on file   Highest education level: Not on file  Occupational History   Not on file  Tobacco Use   Smoking status: Never   Smokeless tobacco: Never  Vaping Use   Vaping Use: Never used  Substance and Sexual Activity   Alcohol use: Yes    Comment: occ   Drug use: No   Sexual activity: Not on file  Other Topics Concern   Not on file  Social History Narrative   Not on file   Social Determinants of Health   Financial Resource Strain: Not on file  Food Insecurity: Not on file  Transportation Needs: Not on file  Physical Activity: Not on file  Stress: Not on file  Social Connections: Not on file  Intimate Partner Violence: Not on file     PHYSICAL EXAM:  VS: Ht '6\' 1"'$  (1.854 m)   Wt 286 lb (129.7 kg)   BMI 37.73 kg/m  Physical Exam Gen: NAD, alert, cooperative with exam, well-appearing      ASSESSMENT & PLAN:   Subacromial bursitis of right shoulder joint Doing well since the steroid injection.  Has minimal pain but overall is much improved. -Counseled on home exercise therapy and supportive care. -Could consider physical therapy or  imaging.

## 2021-03-24 NOTE — Assessment & Plan Note (Signed)
Doing well since the steroid injection.  Has minimal pain but overall is much improved. -Counseled on home exercise therapy and supportive care. -Could consider physical therapy or imaging.

## 2022-11-08 ENCOUNTER — Encounter: Payer: Self-pay | Admitting: *Deleted

## 2022-11-15 IMAGING — DX DG SHOULDER 2+V*R*
3 series · 3 of 3 positions shown · non-contrast
Comparison: No prior.

CLINICAL DATA: Shoulder pain.

EXAM:
RIGHT SHOULDER - 2+ VIEW

[shoulder grashey]
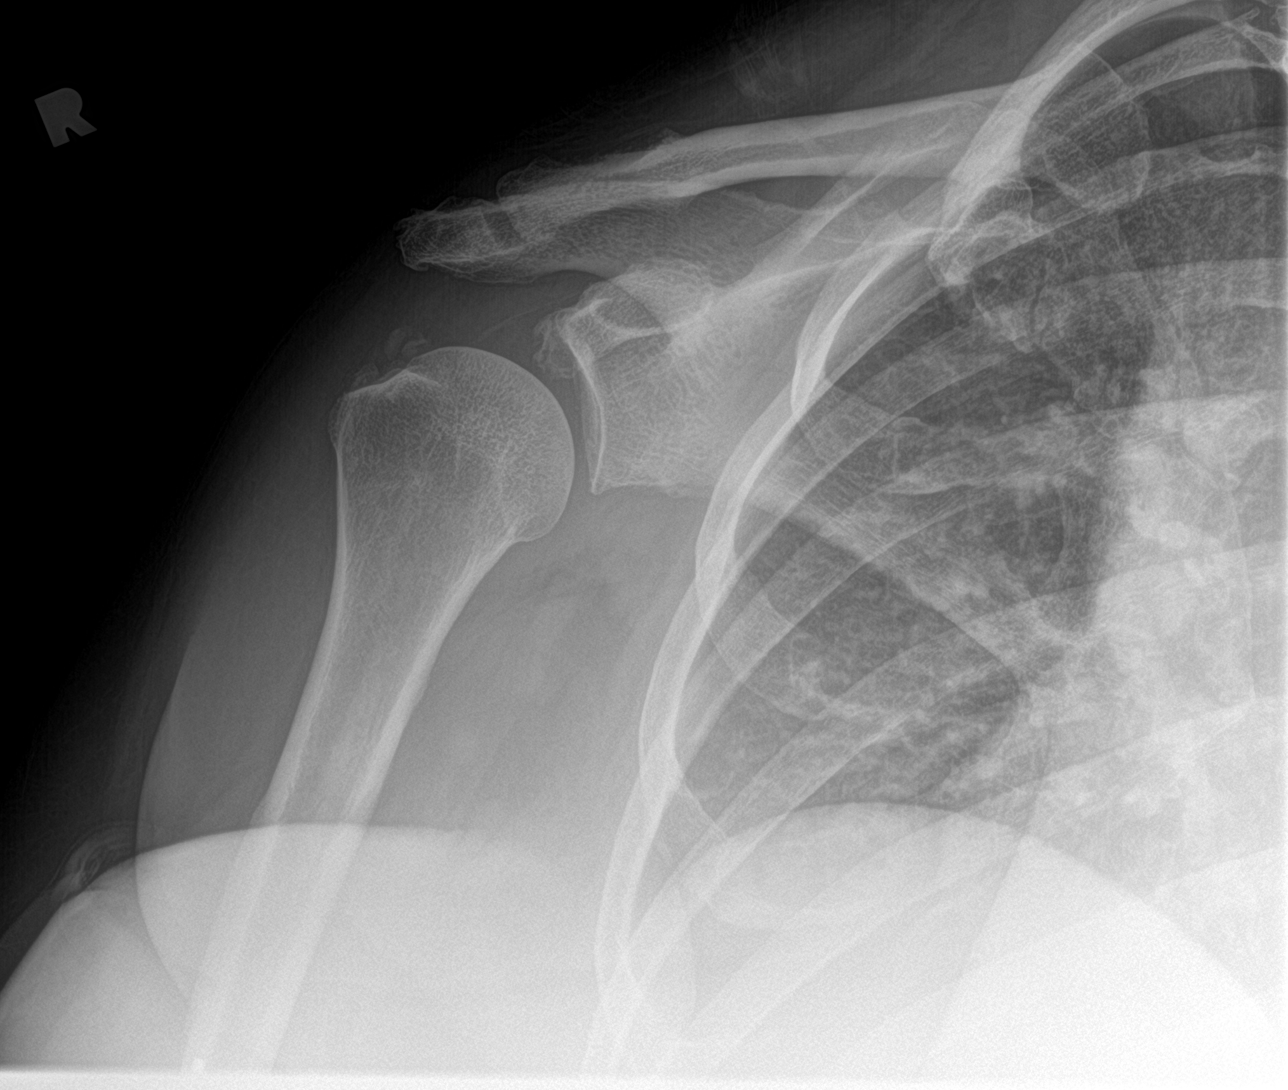

[shoulder y view]
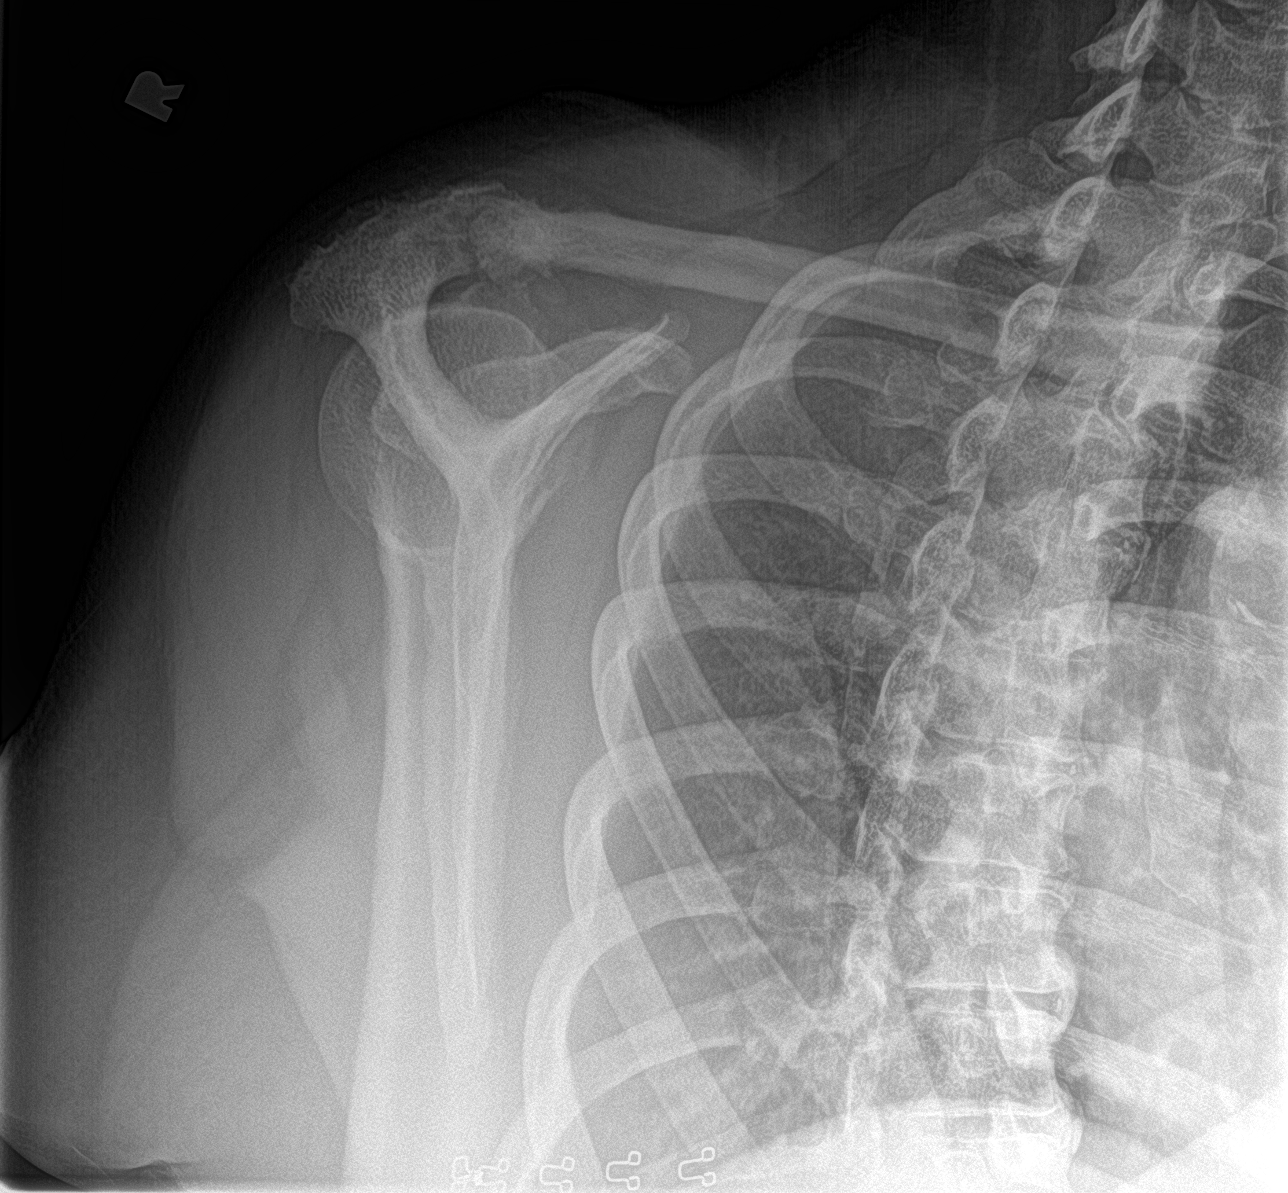

[shoulder ap neutral]
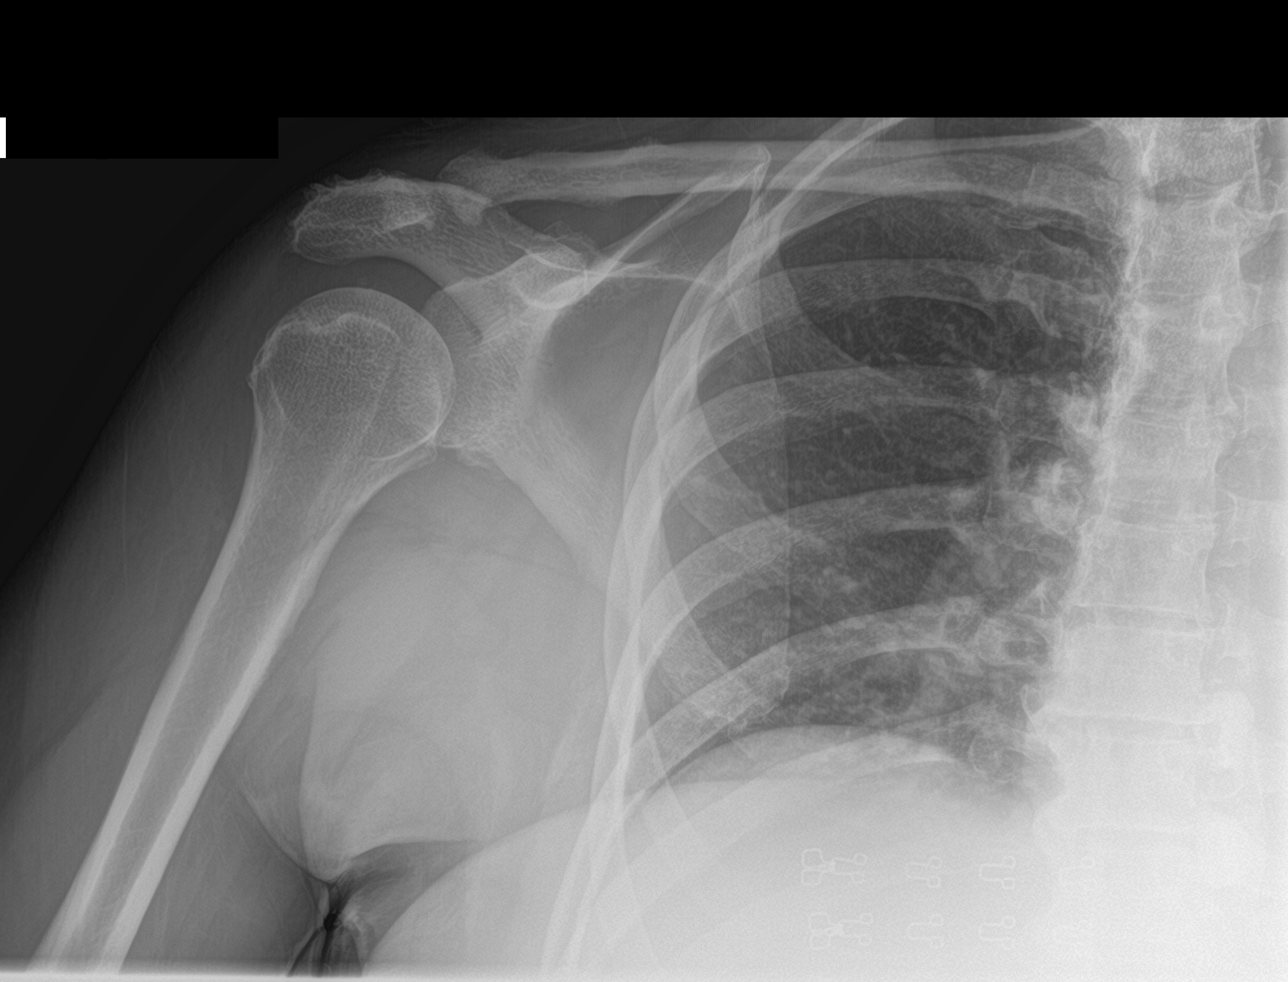

[3 of 3 positions shown; findings below may reference images not displayed]

FINDINGS: Acromioclavicular and glenohumeral degenerative change. Calcific
densities noted over the supraspinatus tendon consistent with
calcific supraspinatus tendinosis. No evidence of fracture,
dislocation, or separation.
IMPRESSION: Acromioclavicular glenohumeral degenerative change. Calcific
supraspinatus tendinosis. No acute abnormality.
